# Patient Record
Sex: Male | Born: 1955 | Race: Black or African American | Hispanic: No | Marital: Married | Smoking: Current some day smoker
Health system: Southern US, Community
[De-identification: ages and names within clinical notes are randomized; demographics above are authoritative.]

## PROBLEM LIST (undated history)

## (undated) DIAGNOSIS — I10 Essential (primary) hypertension: Secondary | ICD-10-CM

## (undated) DIAGNOSIS — K219 Gastro-esophageal reflux disease without esophagitis: Secondary | ICD-10-CM

## (undated) HISTORY — DX: Gastro-esophageal reflux disease without esophagitis: K21.9

## (undated) HISTORY — PX: HERNIA REPAIR: SHX51

---

## 2000-07-11 ENCOUNTER — Emergency Department (HOSPITAL_COMMUNITY): Admission: EM | Admit: 2000-07-11 | Discharge: 2000-07-11 | Payer: Self-pay | Admitting: Emergency Medicine

## 2001-06-24 ENCOUNTER — Encounter: Payer: Self-pay | Admitting: Internal Medicine

## 2001-06-24 ENCOUNTER — Encounter: Admission: RE | Admit: 2001-06-24 | Discharge: 2001-06-24 | Payer: Self-pay | Admitting: Internal Medicine

## 2001-07-02 ENCOUNTER — Encounter: Admission: RE | Admit: 2001-07-02 | Discharge: 2001-07-02 | Payer: Self-pay | Admitting: *Deleted

## 2001-08-21 ENCOUNTER — Observation Stay (HOSPITAL_COMMUNITY): Admission: RE | Admit: 2001-08-21 | Discharge: 2001-08-22 | Payer: Self-pay | Admitting: General Surgery

## 2009-11-22 ENCOUNTER — Emergency Department (HOSPITAL_COMMUNITY): Admission: EM | Admit: 2009-11-22 | Discharge: 2009-11-22 | Payer: Self-pay | Admitting: Emergency Medicine

## 2010-01-04 ENCOUNTER — Ambulatory Visit: Payer: Self-pay | Admitting: Internal Medicine

## 2010-01-04 ENCOUNTER — Encounter (INDEPENDENT_AMBULATORY_CARE_PROVIDER_SITE_OTHER): Payer: Self-pay | Admitting: Family Medicine

## 2010-01-04 LAB — CONVERTED CEMR LAB: Microalb, Ur: 1.09 mg/dL (ref 0.00–1.89)

## 2010-03-07 ENCOUNTER — Ambulatory Visit: Payer: Self-pay | Admitting: Internal Medicine

## 2010-03-07 ENCOUNTER — Encounter (INDEPENDENT_AMBULATORY_CARE_PROVIDER_SITE_OTHER): Payer: Self-pay | Admitting: Family Medicine

## 2010-03-07 LAB — CONVERTED CEMR LAB
ALT: 15 units/L (ref 0–53)
AST: 17 units/L (ref 0–37)
Albumin: 4 g/dL (ref 3.5–5.2)
Alkaline Phosphatase: 61 units/L (ref 39–117)
BUN: 16 mg/dL (ref 6–23)
CO2: 22 meq/L (ref 19–32)
Calcium: 9.1 mg/dL (ref 8.4–10.5)
Chloride: 106 meq/L (ref 96–112)
Cholesterol: 179 mg/dL (ref 0–200)
Creatinine, Ser: 1.14 mg/dL (ref 0.40–1.50)
Glucose, Bld: 95 mg/dL (ref 70–99)
HDL: 45 mg/dL (ref >39)
LDL Cholesterol: 117 mg/dL — ABNORMAL HIGH (ref 0–99)
Potassium: 4.2 meq/L (ref 3.5–5.3)
Sodium: 140 meq/L (ref 135–145)
Total Bilirubin: 0.5 mg/dL (ref 0.3–1.2)
Total CHOL/HDL Ratio: 4
Total Protein: 7.3 g/dL (ref 6.0–8.3)
Triglycerides: 86 mg/dL (ref <150)
VLDL: 17 mg/dL (ref 0–40)

## 2010-03-28 ENCOUNTER — Encounter (INDEPENDENT_AMBULATORY_CARE_PROVIDER_SITE_OTHER): Payer: Self-pay | Admitting: *Deleted

## 2010-03-28 LAB — CONVERTED CEMR LAB: PSA: 0.5 ng/mL (ref <=4.00)

## 2010-09-29 NOTE — Op Note (Signed)
[facility]  Patient:    Erik Reed, Erik Reed Visit Number: [id number] MRN: [id number]          Service Type: SUR Location: [id number] Attending Physician:  Brandy Hale Dictated by:   Angelia Mould. Derrell Lolling, M.D. Proc. Date: 08/21/01 Admit Date:  08/21/2001   CC:         Helene Kelp, M.D.   Operative Report  PREOPERATIVE DIAGNOSES:  1. Bilateral inguinal hernias.  2. Incarcerated ventral epigastric hernia.  POSTOPERATIVE DIAGNOSES:  1. Bilateral inguinal hernias.  2. Incarcerated ventral epigastric hernia.  OPERATION PERFORMED:  1. Laparoscopic preperitoneal repair of bilateral inguinal hernia with     mesh.  2. Open onlay repair of incarcerated epigastric ventral hernia with onlay     mesh.  SURGEON:  Angelia Mould. Derrell Lolling, M.D.  FIRST ASSISTANT:  Abigail Miyamoto, M.D.  INDICATIONS FOR PROCEDURE:  This is a 55 year old black man who has noticed a bulge above his umbilicus for one year or possibly more. This is painful. He had a CT scan done by his primary care physician which showed a midline abdominal hernia through the linea alba 7 cm above the umbilicus with herniated omentum. He was also noted to have bilateral inguinal hernias. On exam, he has a tender reducible mass midway between the xiphoid and the umbilicus. This is a tender nonreducible mass midway between the xiphoid and the umbilicus. He has a moderate sized left inguinal hernia and small right inguinal hernia. Options for intervention were discussed including stage repair versus simultaneous repair of all his hernias. He wanted to have all of this done at one time. He was brought to the operating room for repair.  OPERATIVE FINDINGS:  The patient was short but very muscular and obese. This made it very difficult to visualize his anatomy laparoscopically. Nevertheless, we were able to identify all structures. With about two hours of work, we were able to get the bilateral  inguinal hernia repairs done. He had bilateral indirect inguinal hernias. The ventral epigastric hernia was about midway between the xiphoid and the umbilicus. The defect in the fascia was about 3 cm. There was incarcerated preperitoneal fat which was simply reduced.  OPERATIVE TECHNIQUE:  Following the induction of general endotracheal anesthesia, a Foley catheter was inserted. The abdomen was prepped and draped in a sterile fashion. A 0.5% Marcaine with epinephrine was used as a local infiltration anesthetic. A vertically oriented incision was made at the lower rim of the umbilicus. The fascia was incised transversely exposing the medial border of the right rectus muscle. We inserted the balloon dissector behind the right rectus muscle down to the symphysis pubis and inflated the balloon under direct vision with the camera in place using a manually inflatable air balloon. This had a fairly good deployment of the balloon. We held this in place for about four minutes. The balloon was deflated and removed and an insufflating trocar was inserted and secured. The preperitoneal space was insufflated at 12 mmHg.  As mentioned above, the operative space was small. There was some fatty tissue along the midline which had to be debrided and ultimately removed. During the case, we did get some small openings into the peritoneum which were closed with 5 mm tacking devices but we did place a Veress needle in the right upper quadrant during the case to keep the pneumoperitoneum reduced.  Because the muscles were so large and hanging down in our visual field, we had to place three 5  mm trocars, one on the right, one on the left and one in the midline below the camera trocar. On the left side, we dissected out the cord structures, we identified an indirect hernia and debrided it away from the cord structure all the way back to the level of the anterior superior iliac spine. We cleaned off Coopers  ligament, looked for any evidence of direct hernia and there was not a direct hernia. On the right, he also had a fairly sizeable indirect hernia sac which was dissected away from the cord structures all the way back to the level of the anterior superior iliac spine. We also looked for a direct hernia but did not find any evidence of direct hernia.  On each side, we placed a 4 inch x 6 inch piece of polypropylene mesh. The mesh was oriented and tacked in place with a 5 mm tacking device. The mesh was placed so that it would overlap the symphysis and Coopers ligament medially and was tacked in place along the superior rim of the symphysis and Coopers ligament bilaterally. The mesh was then tacked up the midline and it overlapped in the midline a little bit. The mesh was also secured across the posterior belly of the rectus muscle. Care was taken to avoid the inferior epigastric vessels. Laterally on each side, we placed 5 mm tacks into the muscle above the level of the iliopubic tract. Great care was taken laterally to be sure that we could palpate the tacking device through the skin before firing the tacker to avoid nerve damage.  After all of this was done, the hernia coverage looked good. A little bit of blood was suctioned out and there did not appear to be any active bleeding. We inspected the peritoneum and everything looked good. There was no hole left in the peritoneum at the completion of the case. The pneumoperitoneum was removed. All the trocars were removed. The fascia at the umbilicus was closed with figure-of-eight sutures of #0 Vicryl and skin closed with skin staples.  The hernia in the upper abdomen, we decided to go ahead with an anterior only approach because it was small. We made a midline incision about 10-12 cm in length above the umbilicus overlying the palpable mass. Dissection was carried down through the subcutaneous tissue until we identified the hernia. We  opened the fascia above and below this a little bit and debrided off the hernia sac  and found that there was a large egg size piece of preperitoneal fat which was simply reduced. We closed the fascia transversely with 5 interrupted sutures of #1 Novofil. We undermined the subcutaneous tissue all the way around for about 2 or 3 cm. We took a piece of polypropylene mesh approximately 8 or 9 cm vertically x about 5 cm transversely. This was sutured in place with about 7 or 8 interrupted mattress sutures of #0 Prolene. This overlapped the fascial repair quite nicely. The wound was irrigated with saline. A 19 [place] Blake drain was placed in the wound and brought out through the skin and sutured with nylon suture. This was connected to a suction bulb. The skin incisions were closed with skin staples. Clean bandages were placed, the patient was taken to the recovery room in stable condition. Estimated blood loss was about 75 cc. Complications none. Sponge, needle and instrument counts were correct.Dictated by:   Angelia Mould. Derrell Lolling, M.D. Attending Physician:  Brandy Hale DD:  08/21/01 TD:  08/22/01 Job: [phone] [id number]

## 2012-04-17 ENCOUNTER — Encounter (HOSPITAL_COMMUNITY): Payer: Self-pay | Admitting: Emergency Medicine

## 2012-04-17 ENCOUNTER — Emergency Department (INDEPENDENT_AMBULATORY_CARE_PROVIDER_SITE_OTHER)
Admission: EM | Admit: 2012-04-17 | Discharge: 2012-04-17 | Disposition: A | Discharge status: Home / Self Care | Payer: Self-pay | Source: Home / Self Care | Attending: Emergency Medicine | Admitting: Emergency Medicine

## 2012-04-17 DIAGNOSIS — Z760 Encounter for issue of repeat prescription: Secondary | ICD-10-CM

## 2012-04-17 DIAGNOSIS — I10 Essential (primary) hypertension: Secondary | ICD-10-CM

## 2012-04-17 HISTORY — DX: Essential (primary) hypertension: I10

## 2012-04-17 MED ORDER — LOSARTAN POTASSIUM-HCTZ 100-12.5 MG PO TABS: 1.0000 | ORAL_TABLET | Freq: Every day | ORAL | Status: AC

## 2012-04-17 NOTE — ED Provider Notes (Signed)
History     CSN: [id number]  Arrival date & time 04/17/12  4098   First MD Initiated Contact with Patient 04/17/12 [phone]      Chief Complaint  Patient presents with  . Medication Refill    needs refill on BP meds.    (Consider location/radiation/quality/duration/timing/severity/associated sxs/prior treatment) HPI Comments: Patient presents to urgent care describing that he's been having occasional headaches and lightheadedness as he feels his blood pressure has increased as he has not taken his blood pressure for 5 days as he ran out of it. Patient is a former patient of [facility]. Denies any chest pain palpitations or shortness of breath or lower extremity swelling.   The history is provided by the patient.    Past Medical History  Diagnosis Date  . Hypertension     History reviewed. No pertinent past surgical history.  Family History  Problem Relation Age of Onset  . Hypertension Other     History  Substance Use Topics  . Smoking status: Current Some Day Smoker    Types: Cigars  . Smokeless tobacco: Not on file  . Alcohol Use: Yes     Comment: occasional      Review of Systems  Constitutional: Negative for activity change and appetite change.  HENT: Negative for neck pain and neck stiffness.   Eyes: Negative for visual disturbance.  Respiratory: Negative for shortness of breath.   Cardiovascular: Negative for chest pain and leg swelling.  Neurological: Negative for dizziness, light-headedness and headaches.    Allergies  Review of patient's allergies indicates no known allergies.  Home Medications   Current Outpatient Rx  Name  Route  Sig  Dispense  Refill  . LOSARTAN POTASSIUM-HCTZ 100-12.5 MG PO TABS   Oral   Take 1 tablet by mouth daily.   30 tablet   2     BP 148/93  Pulse 75  Temp 97.9 F (36.6 C) (Oral)  Resp 16  SpO2 95%  Physical Exam  Nursing note and vitals reviewed. Constitutional: Vital signs are normal. He appears  well-developed and well-nourished.  Non-toxic appearance. He does not have a sickly appearance. He does not appear ill.  Eyes: Pupils are equal, round, and reactive to light.  Neck: Neck supple. No JVD present.  Cardiovascular: Normal rate, regular rhythm, normal heart sounds and normal pulses.  Exam reveals no gallop and no friction rub.   No murmur heard.      Bedside informational (non-diagnostic [facility] ECHO) performed single Long Parasternal axis view, no normal ventricular motion or left ventricular dilation or aortic or mitral gross abnormality noted.  Pulmonary/Chest: Effort normal and breath sounds normal.  Skin: Skin is warm.    ED Course  Procedures (including critical care time)  Labs Reviewed - No data to display No results found.   1. Hypertension   2. Encounter for medication refill       MDM  Patient asymptomatic during the clinical encounter requesting medication refills, for antihypertensive medicine. Patient was provided with a 60 day supply of Hyzaar, he was given information to establish continuity of care with the on-call Center at urgent care.        Jimmie Molly, MD 04/17/12 1113

## 2012-04-17 NOTE — ED Notes (Signed)
Pt is c/o HA with lightheadedness. Pt states that he has been without BP meds since 11/26.  Pt needs refill on medication

## 2013-05-04 ENCOUNTER — Ambulatory Visit: Payer: Self-pay | Attending: Internal Medicine

## 2013-05-11 ENCOUNTER — Ambulatory Visit: Payer: Self-pay

## 2016-06-07 ENCOUNTER — Encounter: Payer: Self-pay | Admitting: Internal Medicine

## 2019-03-27 ENCOUNTER — Other Ambulatory Visit: Payer: Self-pay | Admitting: Orthopaedic Surgery

## 2019-04-22 NOTE — H&P (Signed)
TOTAL HIP ADMISSION H&P  Patient is admitted for right total hip arthroplasty.  Subjective:  Chief Complaint: right hip pain  HPI: Erik Reed, 63 y.o. male, has a history of pain and functional disability in the right hip(s) due to arthritis and patient has failed non-surgical conservative treatments for greater than 12 weeks to include NSAID's and/or analgesics, corticosteriod injections, flexibility and strengthening excercises, use of assistive devices, weight reduction as appropriate and activity modification.  Onset of symptoms was gradual starting 5 years ago with gradually worsening course since that time.The patient noted no past surgery on the right hip(s).  Patient currently rates pain in the right hip at 10 out of 10 with activity. Patient has night pain, worsening of pain with activity and weight bearing, trendelenberg gait, pain that interfers with activities of daily living and crepitus. Patient has evidence of subchondral cysts, subchondral sclerosis, periarticular osteophytes and joint space narrowing by imaging studies. This condition presents safety issues increasing the risk of falls. There is no current active infection.  There are no active problems to display for this patient.  Past Medical History:  Diagnosis Date  . Hypertension     No past surgical history on file.  No current facility-administered medications for this encounter.    Current Outpatient Medications  Medication Sig Dispense Refill Last Dose  . aspirin EC 81 MG tablet Take 81 mg by mouth daily.     Marland Kitchen losartan-hydrochlorothiazide (HYZAAR) 100-12.5 MG per tablet Take 1 tablet by mouth daily. 30 tablet 2    No Known Allergies  Social History   Tobacco Use  . Smoking status: Current Some Day Smoker    Types: Cigars  Substance Use Topics  . Alcohol use: Yes    Comment: occasional    Family History  Problem Relation Age of Onset  . Hypertension Other      Review of Systems  Musculoskeletal:  Positive for joint pain.       Right hip  All other systems reviewed and are negative.   Objective:  Physical Exam  Constitutional: He is oriented to person, place, and time. He appears well-developed and well-nourished.  HENT:  Head: Normocephalic and atraumatic.  Eyes: Pupils are equal, round, and reactive to light.  Neck: Normal range of motion.  Cardiovascular: Normal rate and regular rhythm.  Respiratory: Effort normal.  GI: Soft.  Musculoskeletal:     Comments: Examination right hip shows significant limited range of motion with pain at end internal range of motion.  He has 5 out of 5 strength in all sensations of the right hip.  Negative straight leg raise.  He is neurovascularly intact distally.  Examination left hip shows full range of motion.  No tenderness palpation throughout.  Normal sensation throughout.  He is neurovascularly intact distally.  Neurological: He is alert and oriented to person, place, and time.  Skin: Skin is warm and dry.  Psychiatric: He has a normal mood and affect. His behavior is normal. Judgment and thought content normal.    Vital signs in last 24 hours: BP: ()/()  Arterial Line BP: ()/()   Labs:   There is no height or weight on file to calculate BMI.   Imaging Review Plain radiographs demonstrate severe degenerative joint disease of the right hip(s). The bone quality appears to be good for age and reported activity level.   Assessment/Plan:  End stage primary arthritis, right hip(s)  The patient history, physical examination, clinical judgement of the provider and imaging  studies are consistent with end stage degenerative joint disease of the right hip(s) and total hip arthroplasty is deemed medically necessary. The treatment options including medical management, injection therapy, arthroscopy and arthroplasty were discussed at length. The risks and benefits of total hip arthroplasty were presented and reviewed. The risks due to aseptic  loosening, infection, stiffness, dislocation/subluxation,  thromboembolic complications and other imponderables were discussed.  The patient acknowledged the explanation, agreed to proceed with the plan and consent was signed. Patient is being admitted for inpatient treatment for surgery, pain control, PT, OT, prophylactic antibiotics, VTE prophylaxis, progressive ambulation and ADL's and discharge planning.The patient is planning to be discharged home with home health services

## 2019-04-23 ENCOUNTER — Encounter (HOSPITAL_COMMUNITY): Payer: Self-pay

## 2019-04-23 NOTE — Progress Notes (Signed)
PCP - Velna Hatchet Cardiologist -   Chest x-ray -  EKG - 04/24/19 in epic Stress Test -  ECHO -  Cardiac Cath -   Sleep Study -  CPAP -   Fasting Blood Sugar -  Checks Blood Sugar _____ times a day  Blood Thinner Instructions: Aspirin Instructions: Last Dose:  Anesthesia review: PTT 41, abnormal EKG  Patient denies shortness of breath, fever, cough and chest pain at PAT appointment  none   Patient verbalized understanding of instructions that were given to them at the PAT appointment. Patient was also instructed that they will need to review over the PAT instructions again at home before surgery.

## 2019-04-23 NOTE — Patient Instructions (Signed)
DUE TO COVID-19 ONLY ONE VISITOR IS ALLOWED TO COME WITH YOU AND STAY IN THE WAITING ROOM ONLY DURING PRE OP AND PROCEDURE DAY OF SURGERY. THE 1 VISITOR MAY VISIT WITH YOU AFTER SURGERY IN YOUR PRIVATE ROOM DURING VISITING HOURS ONLY!  YOU NEED TO HAVE A COVID 19 TEST ON_______ @_______ , THIS TEST MUST BE DONE BEFORE SURGERY, COME  [address], [place] , [phone].  [facility]) ONCE YOUR COVID TEST IS COMPLETED, PLEASE BEGIN THE QUARANTINE INSTRUCTIONS AS OUTLINED IN YOUR HANDOUT.                Erik Reed  04/23/2019   Your procedure is scheduled on: 04-28-19   [facility]   Report to admitting at      0715  AM     Call this number if you have problems the morning of surgery [phone]    Remember: NO SOLID FOOD AFTER MIDNIGHT THE NIGHT PRIOR TO SURGERY. NOTHING BY MOUTH EXCEPT CLEAR LIQUIDS UNTIL    0640 am . PLEASE FINISH ENSURE DRINK PER SURGEON ORDER  WHICH NEEDS TO BE COMPLETED AT       0640 am then nothing by mouth .    CLEAR LIQUID DIET   Foods Allowed                                                                     Foods Excluded  Coffee and tea, regular and decaf                             liquids that you cannot  Plain Jell-O any favor except red or purple                                           see through such as: Fruit ices (not with fruit pulp)                                     milk, soups, orange juice  Iced Popsicles                                    All solid food Carbonated beverages, regular and diet                                    Cranberry, grape and apple juices Sports drinks like Gatorade Lightly seasoned clear broth or consume(fat free) Sugar, honey syrup  _____________________________________________________________________   BRUSH YOUR TEETH MORNING OF SURGERY AND RINSE YOUR MOUTH OUT, NO CHEWING GUM CANDY OR MINTS.     Take these medicines the morning of surgery with A  SIP OF WATER: none  You may not have any metal on your body including hair pins and              piercings  Do not wear jewelry,  lotions, powders or perfumes, deodorant                          Men may shave face and neck.   Do not bring valuables to the hospital. [facility] IS NOT             RESPONSIBLE   FOR VALUABLES.  Contacts, dentures or bridgework may not be worn into surgery.                 Please read over the following fact sheets you were given: _____________________________________________________________________          [facility] - Preparing for Surgery Before surgery, you can play an important role.  Because skin is not sterile, your skin needs to be as free of germs as possible.  You can reduce the number of germs on your skin by washing with CHG (chlorahexidine gluconate) soap before surgery.  CHG is an antiseptic cleaner which kills germs and bonds with the skin to continue killing germs even after washing. Please DO NOT use if you have an allergy to CHG or antibacterial soaps.  If your skin becomes reddened/irritated stop using the CHG and inform your nurse when you arrive at Short Stay. Do not shave (including legs and underarms) for at least 48 hours prior to the first CHG shower.  You may shave your face/neck. Please follow these instructions carefully:  1.  Shower with CHG Soap the night before surgery and the  morning of Surgery.  2.  If you choose to wash your hair, wash your hair first as usual with your  normal  shampoo.  3.  After you shampoo, rinse your hair and body thoroughly to remove the  shampoo.                           4.  Use CHG as you would any other liquid soap.  You can apply chg directly  to the skin and wash                       Gently with a scrungie or clean washcloth.  5.  Apply the CHG Soap to your body ONLY FROM THE NECK DOWN.   Do not use on face/ open                           Wound or open sores.  Avoid contact with eyes, ears mouth and genitals (private parts).                       Wash face,  Genitals (private parts) with your normal soap.             6.  Wash thoroughly, paying special attention to the area where your surgery  will be performed.  7.  Thoroughly rinse your body with warm water from the neck down.  8.  DO NOT shower/wash with your normal soap after using and rinsing off  the CHG Soap.                9.  Pat yourself dry with a clean towel.  10.  Wear clean pajamas.            11.  Place clean sheets on your bed the night of your first shower and do not  sleep with pets. Day of Surgery : Do not apply any lotions/deodorants the morning of surgery.  Please wear clean clothes to the hospital/surgery center.  FAILURE TO FOLLOW THESE INSTRUCTIONS MAY RESULT IN THE CANCELLATION OF YOUR SURGERY PATIENT SIGNATURE_________________________________  NURSE SIGNATURE__________________________________  ________________________________________________________________________   Erik Reed  An incentive spirometer is a tool that can help keep your lungs clear and active. This tool measures how well you are filling your lungs with each breath. Taking long deep breaths may help reverse or decrease the chance of developing breathing (pulmonary) problems (especially infection) following:  A long period of time when you are unable to move or be active. BEFORE THE PROCEDURE   If the spirometer includes an indicator to show your best effort, your nurse or respiratory therapist will set it to a desired goal.  If possible, sit up straight or lean slightly forward. Try not to slouch.  Hold the incentive spirometer in an upright position. INSTRUCTIONS FOR USE  1. Sit on the edge of your bed if possible, or sit up as far as you can in bed or on a chair. 2. Hold the incentive spirometer in an upright position. 3. Breathe out normally. 4. Place the mouthpiece in your  mouth and seal your lips tightly around it. 5. Breathe in slowly and as deeply as possible, raising the piston or the ball toward the top of the column. 6. Hold your breath for 3-5 seconds or for as long as possible. Allow the piston or ball to fall to the bottom of the column. 7. Remove the mouthpiece from your mouth and breathe out normally. 8. Rest for a few seconds and repeat Steps 1 through 7 at least 10 times every 1-2 hours when you are awake. Take your time and take a few normal breaths between deep breaths. 9. The spirometer may include an indicator to show your best effort. Use the indicator as a goal to work toward during each repetition. 10. After each set of 10 deep breaths, practice coughing to be sure your lungs are clear. If you have an incision (the cut made at the time of surgery), support your incision when coughing by placing a pillow or rolled up towels firmly against it. Once you are able to get out of bed, walk around indoors and cough well. You may stop using the incentive spirometer when instructed by your caregiver.  RISKS AND COMPLICATIONS  Take your time so you do not get dizzy or light-headed.  If you are in pain, you may need to take or ask for pain medication before doing incentive spirometry. It is harder to take a deep breath if you are having pain. AFTER USE  Rest and breathe slowly and easily.  It can be helpful to keep track of a log of your progress. Your caregiver can provide you with a simple table to help with this. If you are using the spirometer at home, follow these instructions: SEEK MEDICAL CARE IF:   You are having difficultly using the spirometer.  You have trouble using the spirometer as often as instructed.  Your pain medication is not giving enough relief while using the spirometer.  You develop fever of 100.5 F (38.1 C) or higher. SEEK IMMEDIATE MEDICAL CARE IF:   You cough up bloody sputum  that had not been present before.  You  develop fever of 102 F (38.9 C) or greater.  You develop worsening pain at or near the incision site. MAKE SURE YOU:   Understand these instructions.  Will watch your condition.  Will get help right away if you are not doing well or get worse. Document Released: 09/10/2006 Document Revised: 07/23/2011 Document Reviewed: 11/11/2006 Exit[employer] Patient Information 2014 Exit[employer], [place].   ________________________________________________________________________  WHAT IS A BLOOD TRANSFUSION? Blood Transfusion Information  A transfusion is the replacement of blood or some of its parts. Blood is made up of multiple cells which provide different functions.  Red blood cells carry oxygen and are used for blood loss replacement.  White blood cells fight against infection.  Platelets control bleeding.  Plasma helps clot blood.  Other blood products are available for specialized needs, such as hemophilia or other clotting disorders. BEFORE THE TRANSFUSION  Who gives blood for transfusions?   Healthy volunteers who are fully evaluated to make sure their blood is safe. This is blood bank blood. Transfusion therapy is the safest it has ever been in the practice of medicine. Before blood is taken from a donor, a complete history is taken to make sure that person has no history of diseases nor engages in risky social behavior (examples are intravenous drug use or sexual activity with multiple partners). The donor's travel history is screened to minimize risk of transmitting infections, such as malaria. The donated blood is tested for signs of infectious diseases, such as HIV and hepatitis. The blood is then tested to be sure it is compatible with you in order to minimize the chance of a transfusion reaction. If you or a relative donates blood, this is often done in anticipation of surgery and is not appropriate for emergency situations. It takes many days to process the donated blood. RISKS AND  COMPLICATIONS Although transfusion therapy is very safe and saves many lives, the main dangers of transfusion include:   Getting an infectious disease.  Developing a transfusion reaction. This is an allergic reaction to something in the blood you were given. Every precaution is taken to prevent this. The decision to have a blood transfusion has been considered carefully by your caregiver before blood is given. Blood is not given unless the benefits outweigh the risks. AFTER THE TRANSFUSION  Right after receiving a blood transfusion, you will usually feel much better and more energetic. This is especially true if your red blood cells have gotten low (anemic). The transfusion raises the level of the red blood cells which carry oxygen, and this usually causes an energy increase.  The nurse administering the transfusion will monitor you carefully for complications. HOME CARE INSTRUCTIONS  No special instructions are needed after a transfusion. You may find your energy is better. Speak with your caregiver about any limitations on activity for underlying diseases you may have. SEEK MEDICAL CARE IF:   Your condition is not improving after your transfusion.  You develop redness or irritation at the intravenous (IV) site. SEEK IMMEDIATE MEDICAL CARE IF:  Any of the following symptoms occur over the next 12 hours:  Shaking chills.  You have a temperature by mouth above 102 F (38.9 C), not controlled by medicine.  Chest, back, or muscle pain.  People around you feel you are not acting correctly or are confused.  Shortness of breath or difficulty breathing.  Dizziness and fainting.  You get a rash or develop hives.  You have a decrease  in urine output.  Your urine turns a dark color or changes to pink, red, or brown. Any of the following symptoms occur over the next 10 days:  You have a temperature by mouth above 102 F (38.9 C), not controlled by medicine.  Shortness of  breath.  Weakness after normal activity.  The white part of the eye turns yellow (jaundice).  You have a decrease in the amount of urine or are urinating less often.  Your urine turns a dark color or changes to pink, red, or brown. Document Released: 04/27/2000 Document Revised: 07/23/2011 Document Reviewed: 12/15/2007 [facility] Patient Information 2014 [place], [place].  _______________________________________________________________________

## 2019-04-24 ENCOUNTER — Other Ambulatory Visit: Payer: Self-pay

## 2019-04-24 ENCOUNTER — Encounter (HOSPITAL_COMMUNITY)
Admission: RE | Admit: 2019-04-24 | Discharge: 2019-04-24 | Disposition: A | Discharge status: Home / Self Care | Payer: Managed Care, Other (non HMO) | Source: Ambulatory Visit | Attending: Orthopaedic Surgery | Admitting: Orthopaedic Surgery

## 2019-04-24 ENCOUNTER — Ambulatory Visit (HOSPITAL_COMMUNITY)
Admission: RE | Admit: 2019-04-24 | Discharge: 2019-04-24 | Disposition: A | Discharge status: Home / Self Care | Payer: Managed Care, Other (non HMO) | Source: Ambulatory Visit | Attending: Orthopaedic Surgery | Admitting: Orthopaedic Surgery

## 2019-04-24 ENCOUNTER — Encounter (HOSPITAL_COMMUNITY): Payer: Self-pay

## 2019-04-24 ENCOUNTER — Other Ambulatory Visit (HOSPITAL_COMMUNITY)
Admission: RE | Admit: 2019-04-24 | Discharge: 2019-04-24 | Disposition: A | Discharge status: Home / Self Care | Payer: Managed Care, Other (non HMO) | Source: Ambulatory Visit | Attending: Orthopaedic Surgery | Admitting: Orthopaedic Surgery

## 2019-04-24 DIAGNOSIS — Z20828 Contact with and (suspected) exposure to other viral communicable diseases: Secondary | ICD-10-CM | POA: Insufficient documentation

## 2019-04-24 DIAGNOSIS — M1611 Unilateral primary osteoarthritis, right hip: Secondary | ICD-10-CM | POA: Diagnosis not present

## 2019-04-24 DIAGNOSIS — I498 Other specified cardiac arrhythmias: Secondary | ICD-10-CM | POA: Insufficient documentation

## 2019-04-24 DIAGNOSIS — Z01818 Encounter for other preprocedural examination: Secondary | ICD-10-CM

## 2019-04-24 LAB — CBC WITH DIFFERENTIAL/PLATELET
Abs Immature Granulocytes: 0.02 10*3/uL (ref 0.00–0.07)
Basophils Absolute: 0 10*3/uL (ref 0.0–0.1)
Basophils Relative: 1 %
Eosinophils Absolute: 0.1 10*3/uL (ref 0.0–0.5)
Eosinophils Relative: 2 %
HCT: 45.8 % (ref 39.0–52.0)
Hemoglobin: 14.6 g/dL (ref 13.0–17.0)
Immature Granulocytes: 0 %
Lymphocytes Relative: 37 %
Lymphs Abs: 1.6 10*3/uL (ref 0.7–4.0)
MCH: 26.7 pg (ref 26.0–34.0)
MCHC: 31.9 g/dL (ref 30.0–36.0)
MCV: 83.9 fL (ref 80.0–100.0)
Monocytes Absolute: 0.4 10*3/uL (ref 0.1–1.0)
Monocytes Relative: 10 %
Neutro Abs: 2.3 10*3/uL (ref 1.7–7.7)
Neutrophils Relative %: 50 %
Platelets: 322 10*3/uL (ref 150–400)
RBC: 5.46 MIL/uL (ref 4.22–5.81)
RDW: 14.9 % (ref 11.5–15.5)
WBC: 4.5 10*3/uL (ref 4.0–10.5)
nRBC: 0 % (ref 0.0–0.2)

## 2019-04-24 LAB — BASIC METABOLIC PANEL
Anion gap: 10 (ref 5–15)
BUN: 24 mg/dL — ABNORMAL HIGH (ref 8–23)
CO2: 26 mmol/L (ref 22–32)
Calcium: 9.4 mg/dL (ref 8.9–10.3)
Chloride: 103 mmol/L (ref 98–111)
Creatinine, Ser: 1.4 mg/dL — ABNORMAL HIGH (ref 0.61–1.24)
GFR calc Af Amer: >60 mL/min (ref >60)
GFR calc non Af Amer: 53 mL/min — ABNORMAL LOW (ref >60)
Glucose, Bld: 104 mg/dL — ABNORMAL HIGH (ref 70–99)
Potassium: 4.4 mmol/L (ref 3.5–5.1)
Sodium: 139 mmol/L (ref 135–145)

## 2019-04-24 LAB — URINALYSIS, ROUTINE W REFLEX MICROSCOPIC
Bilirubin Urine: NEGATIVE
Glucose, UA: NEGATIVE mg/dL
Hgb urine dipstick: NEGATIVE
Ketones, ur: NEGATIVE mg/dL
Leukocytes,Ua: NEGATIVE
Nitrite: NEGATIVE
Protein, ur: NEGATIVE mg/dL
Specific Gravity, Urine: 1.021 (ref 1.005–1.030)
pH: 6 (ref 5.0–8.0)

## 2019-04-24 LAB — APTT: aPTT: 41 seconds — ABNORMAL HIGH (ref 24–36)

## 2019-04-24 LAB — PROTIME-INR
INR: 0.9 (ref 0.8–1.2)
Prothrombin Time: 12.2 seconds (ref 11.4–15.2)

## 2019-04-24 LAB — SURGICAL PCR SCREEN
MRSA, PCR: NEGATIVE
Staphylococcus aureus: NEGATIVE

## 2019-04-25 LAB — NOVEL CORONAVIRUS, NAA (HOSP ORDER, SEND-OUT TO REF LAB; TAT 18-24 HRS): SARS-CoV-2, NAA: NOT DETECTED

## 2019-04-25 LAB — ABO/RH: ABO/RH(D): O POS

## 2019-04-27 MED ORDER — BUPIVACAINE LIPOSOME 1.3 % IJ SUSP
10.0000 mL | Freq: Once | INTRAMUSCULAR | Status: DC
  Filled 2019-04-27: qty 10

## 2019-04-27 MED ORDER — TRANEXAMIC ACID 1000 MG/10ML IV SOLN
2000.0000 mg | INTRAVENOUS | Status: DC
  Filled 2019-04-27: qty 20

## 2019-04-27 MED ORDER — DEXTROSE 5 % IV SOLN
3.0000 g | INTRAVENOUS | Status: AC
  Administered 2019-04-28: 10:00:00 3 g via INTRAVENOUS
  Filled 2019-04-27: qty 3

## 2019-04-28 ENCOUNTER — Observation Stay (HOSPITAL_COMMUNITY)
Admission: RE | Admit: 2019-04-28 | Discharge: 2019-04-28 | Disposition: A | Discharge status: Home / Self Care | Payer: Managed Care, Other (non HMO) | Attending: Orthopaedic Surgery | Admitting: Orthopaedic Surgery

## 2019-04-28 ENCOUNTER — Encounter (HOSPITAL_COMMUNITY)
Admission: RE | Disposition: A | Discharge status: Home / Self Care | Payer: Self-pay | Source: Home / Self Care | Attending: Orthopaedic Surgery

## 2019-04-28 ENCOUNTER — Ambulatory Visit (HOSPITAL_COMMUNITY): Payer: Managed Care, Other (non HMO) | Admitting: Anesthesiology

## 2019-04-28 ENCOUNTER — Ambulatory Visit (HOSPITAL_COMMUNITY): Payer: Managed Care, Other (non HMO)

## 2019-04-28 ENCOUNTER — Ambulatory Visit (HOSPITAL_COMMUNITY): Payer: Managed Care, Other (non HMO) | Admitting: Physician Assistant

## 2019-04-28 ENCOUNTER — Encounter (HOSPITAL_COMMUNITY): Payer: Self-pay | Admitting: Orthopaedic Surgery

## 2019-04-28 DIAGNOSIS — Z6839 Body mass index (BMI) 39.0-39.9, adult: Secondary | ICD-10-CM | POA: Insufficient documentation

## 2019-04-28 DIAGNOSIS — R262 Difficulty in walking, not elsewhere classified: Secondary | ICD-10-CM | POA: Diagnosis not present

## 2019-04-28 DIAGNOSIS — I10 Essential (primary) hypertension: Secondary | ICD-10-CM | POA: Diagnosis not present

## 2019-04-28 DIAGNOSIS — Z419 Encounter for procedure for purposes other than remedying health state, unspecified: Secondary | ICD-10-CM

## 2019-04-28 DIAGNOSIS — Z7982 Long term (current) use of aspirin: Secondary | ICD-10-CM | POA: Diagnosis not present

## 2019-04-28 DIAGNOSIS — M1611 Unilateral primary osteoarthritis, right hip: Principal | ICD-10-CM | POA: Insufficient documentation

## 2019-04-28 DIAGNOSIS — Z79899 Other long term (current) drug therapy: Secondary | ICD-10-CM | POA: Diagnosis not present

## 2019-04-28 DIAGNOSIS — Z8249 Family history of ischemic heart disease and other diseases of the circulatory system: Secondary | ICD-10-CM | POA: Diagnosis not present

## 2019-04-28 DIAGNOSIS — F17290 Nicotine dependence, other tobacco product, uncomplicated: Secondary | ICD-10-CM | POA: Diagnosis not present

## 2019-04-28 HISTORY — PX: TOTAL HIP ARTHROPLASTY: SHX124

## 2019-04-28 LAB — TYPE AND SCREEN
ABO/RH(D): O POS
Antibody Screen: NEGATIVE

## 2019-04-28 SURGERY — ARTHROPLASTY, HIP, TOTAL, ANTERIOR APPROACH
Anesthesia: General | Site: Hip | Laterality: Right

## 2019-04-28 MED ORDER — PROPOFOL 10 MG/ML IV BOLUS
INTRAVENOUS | Status: DC | PRN
  Administered 2019-04-28 (×4): 50 mg via INTRAVENOUS

## 2019-04-28 MED ORDER — CEFAZOLIN SODIUM-DEXTROSE 2-4 GM/100ML-% IV SOLN
2.0000 g | Freq: Four times a day (QID) | INTRAVENOUS | Status: DC
  Administered 2019-04-28: 2 g via INTRAVENOUS

## 2019-04-28 MED ORDER — LACTATED RINGERS IV SOLN: INTRAVENOUS | Status: DC

## 2019-04-28 MED ORDER — TRANEXAMIC ACID-NACL 1000-0.7 MG/100ML-% IV SOLN
1000.0000 mg | Freq: Once | INTRAVENOUS | Status: DC
  Filled 2019-04-28: qty 100

## 2019-04-28 MED ORDER — ACETAMINOPHEN 500 MG PO TABS
1000.0000 mg | ORAL_TABLET | Freq: Once | ORAL | Status: AC
  Administered 2019-04-28: 1000 mg via ORAL
  Filled 2019-04-28: qty 2

## 2019-04-28 MED ORDER — MIDAZOLAM HCL 2 MG/2ML IJ SOLN
INTRAMUSCULAR | Status: AC
  Filled 2019-04-28: qty 2

## 2019-04-28 MED ORDER — HYDROMORPHONE HCL 1 MG/ML IJ SOLN
INTRAMUSCULAR | Status: AC
  Filled 2019-04-28: qty 1

## 2019-04-28 MED ORDER — HYDROMORPHONE HCL 1 MG/ML IJ SOLN: 0.2500 mg | INTRAMUSCULAR | Status: DC | PRN

## 2019-04-28 MED ORDER — BUPIVACAINE HCL (PF) 0.25 % IJ SOLN
INTRAMUSCULAR | Status: AC
  Filled 2019-04-28: qty 30

## 2019-04-28 MED ORDER — MIDAZOLAM HCL 5 MG/5ML IJ SOLN
INTRAMUSCULAR | Status: DC | PRN
  Administered 2019-04-28: 2 mg via INTRAVENOUS

## 2019-04-28 MED ORDER — CHLORHEXIDINE GLUCONATE 4 % EX LIQD: 60.0000 mL | Freq: Once | CUTANEOUS | Status: DC

## 2019-04-28 MED ORDER — BUPIVACAINE HCL (PF) 0.25 % IJ SOLN
INTRAMUSCULAR | Status: DC | PRN
  Administered 2019-04-28: 30 mL

## 2019-04-28 MED ORDER — FENTANYL CITRATE (PF) 250 MCG/5ML IJ SOLN
INTRAMUSCULAR | Status: DC | PRN
  Administered 2019-04-28: 100 ug via INTRAVENOUS
  Administered 2019-04-28 (×3): 50 ug via INTRAVENOUS

## 2019-04-28 MED ORDER — CEFAZOLIN SODIUM-DEXTROSE 2-4 GM/100ML-% IV SOLN
INTRAVENOUS | Status: AC
  Filled 2019-04-28: qty 100

## 2019-04-28 MED ORDER — HYDROMORPHONE HCL 1 MG/ML IJ SOLN
0.2500 mg | INTRAMUSCULAR | Status: DC | PRN
  Administered 2019-04-28 (×4): 0.5 mg via INTRAVENOUS

## 2019-04-28 MED ORDER — LIDOCAINE 2% (20 MG/ML) 5 ML SYRINGE
INTRAMUSCULAR | Status: AC
  Filled 2019-04-28: qty 5

## 2019-04-28 MED ORDER — ASPIRIN EC 81 MG PO TBEC: 81.0000 mg | DELAYED_RELEASE_TABLET | Freq: Two times a day (BID) | ORAL | 0 refills | Status: AC

## 2019-04-28 MED ORDER — DEXAMETHASONE SODIUM PHOSPHATE 10 MG/ML IJ SOLN
INTRAMUSCULAR | Status: DC | PRN
  Administered 2019-04-28: 10 mg via INTRAVENOUS

## 2019-04-28 MED ORDER — DEXAMETHASONE SODIUM PHOSPHATE 10 MG/ML IJ SOLN
INTRAMUSCULAR | Status: AC
  Filled 2019-04-28: qty 1

## 2019-04-28 MED ORDER — OXYCODONE HCL 5 MG PO TABS: 5.0000 mg | ORAL_TABLET | Freq: Once | ORAL | Status: DC | PRN

## 2019-04-28 MED ORDER — SUCCINYLCHOLINE CHLORIDE 200 MG/10ML IV SOSY
PREFILLED_SYRINGE | INTRAVENOUS | Status: AC
  Filled 2019-04-28: qty 10

## 2019-04-28 MED ORDER — METHOCARBAMOL 500 MG IVPB - SIMPLE MED
500.0000 mg | Freq: Four times a day (QID) | INTRAVENOUS | Status: DC | PRN
  Administered 2019-04-28: 500 mg via INTRAVENOUS

## 2019-04-28 MED ORDER — PROMETHAZINE HCL 25 MG/ML IJ SOLN
6.2500 mg | INTRAMUSCULAR | Status: DC | PRN
  Administered 2019-04-28: 12.5 mg via INTRAVENOUS

## 2019-04-28 MED ORDER — POVIDONE-IODINE 10 % EX SWAB
2.0000 "application " | Freq: Once | CUTANEOUS | Status: AC
  Administered 2019-04-28: 2 via TOPICAL

## 2019-04-28 MED ORDER — STERILE WATER FOR IRRIGATION IR SOLN
Status: DC | PRN
  Administered 2019-04-28: 2000 mL

## 2019-04-28 MED ORDER — KETOROLAC TROMETHAMINE 15 MG/ML IJ SOLN: 15.0000 mg | Freq: Four times a day (QID) | INTRAMUSCULAR | Status: DC

## 2019-04-28 MED ORDER — OXYCODONE HCL 5 MG/5ML PO SOLN: 5.0000 mg | Freq: Once | ORAL | Status: DC | PRN

## 2019-04-28 MED ORDER — HYDROCODONE-ACETAMINOPHEN 5-325 MG PO TABS: 1.0000 | ORAL_TABLET | ORAL | 0 refills | Status: AC | PRN

## 2019-04-28 MED ORDER — PROPOFOL 10 MG/ML IV BOLUS
INTRAVENOUS | Status: AC
  Filled 2019-04-28: qty 20

## 2019-04-28 MED ORDER — KETOROLAC TROMETHAMINE 30 MG/ML IJ SOLN
INTRAMUSCULAR | Status: AC
  Filled 2019-04-28: qty 1

## 2019-04-28 MED ORDER — HYDROCODONE-ACETAMINOPHEN 7.5-325 MG PO TABS: 1.0000 | ORAL_TABLET | ORAL | Status: DC | PRN

## 2019-04-28 MED ORDER — TRANEXAMIC ACID 1000 MG/10ML IV SOLN
INTRAVENOUS | Status: DC | PRN
  Administered 2019-04-28: 2000 mg via TOPICAL

## 2019-04-28 MED ORDER — PROMETHAZINE HCL 25 MG/ML IJ SOLN
INTRAMUSCULAR | Status: AC
  Filled 2019-04-28: qty 1

## 2019-04-28 MED ORDER — KETOROLAC TROMETHAMINE 30 MG/ML IJ SOLN
30.0000 mg | Freq: Once | INTRAMUSCULAR | Status: AC
  Administered 2019-04-28: 30 mg via INTRAVENOUS

## 2019-04-28 MED ORDER — TRANEXAMIC ACID-NACL 1000-0.7 MG/100ML-% IV SOLN
1000.0000 mg | INTRAVENOUS | Status: AC
  Administered 2019-04-28: 10:00:00 1000 mg via INTRAVENOUS
  Filled 2019-04-28: qty 100

## 2019-04-28 MED ORDER — TIZANIDINE HCL 4 MG PO TABS: 4.0000 mg | ORAL_TABLET | Freq: Four times a day (QID) | ORAL | 1 refills | Status: AC | PRN

## 2019-04-28 MED ORDER — METHOCARBAMOL 500 MG PO TABS: 500.0000 mg | ORAL_TABLET | Freq: Four times a day (QID) | ORAL | Status: DC | PRN

## 2019-04-28 MED ORDER — FENTANYL CITRATE (PF) 250 MCG/5ML IJ SOLN
INTRAMUSCULAR | Status: AC
  Filled 2019-04-28: qty 5

## 2019-04-28 MED ORDER — BUPIVACAINE LIPOSOME 1.3 % IJ SUSP
INTRAMUSCULAR | Status: DC | PRN
  Administered 2019-04-28: 10 mL

## 2019-04-28 MED ORDER — ONDANSETRON HCL 4 MG/2ML IJ SOLN
INTRAMUSCULAR | Status: DC | PRN
  Administered 2019-04-28: 4 mg via INTRAVENOUS

## 2019-04-28 MED ORDER — ROCURONIUM BROMIDE 10 MG/ML (PF) SYRINGE
PREFILLED_SYRINGE | INTRAVENOUS | Status: AC
  Filled 2019-04-28: qty 10

## 2019-04-28 MED ORDER — ONDANSETRON HCL 4 MG/2ML IJ SOLN
INTRAMUSCULAR | Status: AC
  Filled 2019-04-28: qty 2

## 2019-04-28 MED ORDER — HYDROCODONE-ACETAMINOPHEN 5-325 MG PO TABS
1.0000 | ORAL_TABLET | ORAL | Status: DC | PRN
  Administered 2019-04-28: 1 via ORAL

## 2019-04-28 MED ORDER — 0.9 % SODIUM CHLORIDE (POUR BTL) OPTIME
TOPICAL | Status: DC | PRN
  Administered 2019-04-28: 1000 mL

## 2019-04-28 MED ORDER — METHOCARBAMOL 500 MG IVPB - SIMPLE MED
INTRAVENOUS | Status: AC
  Filled 2019-04-28: qty 50

## 2019-04-28 MED ORDER — LIDOCAINE 2% (20 MG/ML) 5 ML SYRINGE
INTRAMUSCULAR | Status: DC | PRN
  Administered 2019-04-28: 80 mg via INTRAVENOUS

## 2019-04-28 MED ORDER — SUGAMMADEX SODIUM 200 MG/2ML IV SOLN
INTRAVENOUS | Status: DC | PRN
  Administered 2019-04-28: 300 mg via INTRAVENOUS

## 2019-04-28 MED ORDER — ROCURONIUM BROMIDE 10 MG/ML (PF) SYRINGE
PREFILLED_SYRINGE | INTRAVENOUS | Status: DC | PRN
  Administered 2019-04-28 (×3): 10 mg via INTRAVENOUS
  Administered 2019-04-28: 60 mg via INTRAVENOUS

## 2019-04-28 MED ORDER — SODIUM CHLORIDE 0.9 % IV BOLUS
500.0000 mL | Freq: Two times a day (BID) | INTRAVENOUS | Status: DC
  Administered 2019-04-28: 500 mL via INTRAVENOUS

## 2019-04-28 MED ORDER — HYDROCODONE-ACETAMINOPHEN 5-325 MG PO TABS
ORAL_TABLET | ORAL | Status: AC
  Filled 2019-04-28: qty 1

## 2019-04-28 SURGICAL SUPPLY — 47 items
BAG DECANTER FOR FLEXI CONT (MISCELLANEOUS) ×3 IMPLANT
BALL HIP CERAMIC (Hips) ×1 IMPLANT
BALL HIP CERAMIC 32MM PLUS 9 ×1 IMPLANT
BLADE SAW SGTL 18X1.27X75 (BLADE) ×2 IMPLANT
BLADE SAW SGTL 18X1.27X75MM (BLADE) ×1
BOOTIES KNEE HIGH SLOAN (MISCELLANEOUS) ×3 IMPLANT
CELLS DAT CNTRL 66122 CELL SVR (MISCELLANEOUS) ×1 IMPLANT
COVER PERINEAL POST (MISCELLANEOUS) ×3 IMPLANT
COVER SURGICAL LIGHT HANDLE (MISCELLANEOUS) ×3 IMPLANT
COVER WAND RF STERILE (DRAPES) IMPLANT
DECANTER SPIKE VIAL GLASS SM (MISCELLANEOUS) ×3 IMPLANT
DRAPE IMP U-DRAPE 54X76 (DRAPES) ×3 IMPLANT
DRAPE STERI IOBAN 125X83 (DRAPES) ×3 IMPLANT
DRAPE U-SHAPE 47X51 STRL (DRAPES) ×6 IMPLANT
DRSG AQUACEL AG ADV 3.5X 6 (GAUZE/BANDAGES/DRESSINGS) ×3 IMPLANT
DURAPREP 26ML APPLICATOR (WOUND CARE) ×3 IMPLANT
ELECT BLADE TIP CTD 4 INCH (ELECTRODE) ×3 IMPLANT
ELECT REM PT RETURN 15FT ADLT (MISCELLANEOUS) ×3 IMPLANT
ELIMINATOR HOLE APEX DEPUY (Hips) ×3 IMPLANT
FEM STEM 12/14 TAPER SZ 4 HIP (Orthopedic Implant) ×3 IMPLANT
FEMORAL STEM 12/14 TPR SZ4 HIP (Orthopedic Implant) ×1 IMPLANT
GLOVE BIO SURGEON STRL SZ8 (GLOVE) ×6 IMPLANT
GLOVE BIOGEL PI IND STRL 8 (GLOVE) ×2 IMPLANT
GLOVE BIOGEL PI INDICATOR 8 (GLOVE) ×4
GOWN STRL REUS W/TWL XL LVL3 (GOWN DISPOSABLE) ×6 IMPLANT
HIP BALL CERAMIC (Hips) ×3 IMPLANT
HIP BALL CERAMIC 32MM PLUS 9 ×3 IMPLANT
HOLDER FOLEY CATH W/STRAP (MISCELLANEOUS) ×3 IMPLANT
KIT TURNOVER KIT A (KITS) IMPLANT
LINER ACETABULAR 32X50 (Liner) ×3 IMPLANT
LINER PINN ACET GRIP 50X100 ×3 IMPLANT
MANIFOLD NEPTUNE II (INSTRUMENTS) ×3 IMPLANT
NEEDLE HYPO 22GX1.5 SAFETY (NEEDLE) ×3 IMPLANT
NS IRRIG 1000ML POUR BTL (IV SOLUTION) ×3 IMPLANT
PACK ANTERIOR HIP CUSTOM (KITS) ×3 IMPLANT
PENCIL SMOKE EVACUATOR (MISCELLANEOUS) IMPLANT
PROTECTOR NERVE ULNAR (MISCELLANEOUS) ×3 IMPLANT
RTRCTR WOUND ALEXIS 18CM MED (MISCELLANEOUS) ×3
SUT ETHIBOND NAB CT1 #1 30IN (SUTURE) ×6 IMPLANT
SUT VIC AB 1 CT1 36 (SUTURE) ×3 IMPLANT
SUT VIC AB 2-0 CT1 27 (SUTURE) ×2
SUT VIC AB 2-0 CT1 TAPERPNT 27 (SUTURE) ×1 IMPLANT
SUT VIC AB 3-0 PS2 18 (SUTURE) ×2
SUT VIC AB 3-0 PS2 18XBRD (SUTURE) ×1 IMPLANT
SUT VLOC 180 0 24IN GS25 (SUTURE) ×3 IMPLANT
SYR 50ML LL SCALE MARK (SYRINGE) ×3 IMPLANT
YANKAUER SUCT BULB TIP 10FT TU (MISCELLANEOUS) ×3 IMPLANT

## 2019-04-28 NOTE — Evaluation (Signed)
Physical Therapy Evaluation Patient Details Name: Erik Reed MRN: [id number] DOB: 24-Jul-1955 Today's Date: 04/28/2019   History of Present Illness  s/p DA R THA. PMH: HTN  Clinical Impression  Patient evaluated by Physical Therapy with no further acute PT needs identified. All education has been completed and the patient has no further questions.  Pt is ready for d/c from PT standpoint.   See below for any follow-up Physical Therapy or equipment needs. PT is signing off. Thank you for this referral.      Follow Up Recommendations Follow surgeon's recommendation for DC plan and follow-up therapies    Equipment Recommendations  None recommended by PT    Recommendations for Other Services       Precautions / Restrictions Precautions Precautions: Fall Restrictions Weight Bearing Restrictions: No Other Position/Activity Restrictions: WBAT      Mobility  Bed Mobility               General bed mobility comments: NT in recliner  Transfers Overall transfer level: Needs assistance Equipment used: Rolling walker (2 wheeled) Transfers: Sit to/from Stand Sit to Stand: Min guard         General transfer comment: cues for hand placement  Ambulation/Gait Ambulation/Gait assistance: Min guard Gait Distance (Feet): 110 Feet Assistive device: Rolling walker (2 wheeled) Gait Pattern/deviations: Step-to pattern;Step-through pattern;Decreased stance time - right     General Gait Details: cues for RW safety  Stairs Stairs: Yes Stairs assistance: Min guard Stair Management: One rail Right;One rail Left;Forwards Number of Stairs: 5 General stair comments: cues for sequence and safety  Wheelchair Mobility    Modified Rankin (Stroke Patients Only)       Balance                                             Pertinent Vitals/Pain Pain Assessment: 0-10 Pain Score: 4  Pain Location: right hip Pain Descriptors / Indicators:  Grimacing;Guarding Pain Intervention(s): Limited activity within patient's tolerance;Monitored during session;Premedicated before session;Repositioned    Home Living Family/patient expects to be discharged to:: Private residence Living Arrangements: Spouse/significant other Available Help at Discharge: Family Type of Home: House Home Access: Stairs to enter   Technical brewer of Steps: 1 Home Layout: Two level Home Equipment: Environmental consultant - 2 wheels      Prior Function Level of Independence: Independent               Hand Dominance        Extremity/Trunk Assessment   Upper Extremity Assessment Upper Extremity Assessment: Overall WFL for tasks assessed    Lower Extremity Assessment Lower Extremity Assessment: RLE deficits/detail RLE Deficits / Details: ankle WFL, knee and hip grossly 3/5       Communication   Communication: No difficulties  Cognition Arousal/Alertness: Awake/alert Behavior During Therapy: WFL for tasks assessed/performed Overall Cognitive Status: Within Functional Limits for tasks assessed                                        General Comments      Exercises Total Joint Exercises Ankle Circles/Pumps: AROM;Both;10 reps Quad Sets: 10 reps;Both;AROM Heel Slides: AAROM;Right;10 reps   Assessment/Plan    PT Assessment All further PT needs can be met in the next venue of care  PT Problem List         PT Treatment Interventions      PT Goals (Current goals can be found in the Care Plan section)  Acute Rehab PT Goals PT Goal Formulation: All assessment and education complete, DC therapy    Frequency     Barriers to discharge        Co-evaluation               AM-PAC PT "6 Clicks" Mobility  Outcome Measure Help needed turning from your back to your side while in a flat bed without using bedrails?: A Little Help needed moving from lying on your back to sitting on the side of a flat bed without using  bedrails?: A Little Help needed moving to and from a bed to a chair (including a wheelchair)?: A Little Help needed standing up from a chair using your arms (e.g., wheelchair or bedside chair)?: A Little Help needed to walk in hospital room?: A Little Help needed climbing 3-5 steps with a railing? : A Little 6 Click Score: 18    End of Session Equipment Utilized During Treatment: Gait belt Activity Tolerance: Patient tolerated treatment well Patient left: with call bell/phone within reach;in chair   PT Visit Diagnosis: Difficulty in walking, not elsewhere classified (R26.2)    Time: 4098-2867 PT Time Calculation (min) (ACUTE ONLY): 15 min   Charges:   PT Evaluation $PT Eval Low Complexity: 1 Low          Matie Dimaano, PT   Acute Rehab Dept [facility]): [phone]   04/28/2019   [facility] 04/28/2019, 6:39 PM

## 2019-04-28 NOTE — Interval H&P Note (Signed)
History and Physical Interval Note:  04/28/2019 8:59 AM  Erik Reed  has presented today for surgery, with the diagnosis of RIGHT HIP DEGENERATIVE JOINT DISEASE.  The various methods of treatment have been discussed with the patient and family. After consideration of risks, benefits and other options for treatment, the patient has consented to  Procedure(s): RIGHT TOTAL HIP ARTHROPLASTY ANTERIOR APPROACH (Right) as a surgical intervention.  The patient's history has been reviewed, patient examined, no change in status, stable for surgery.  I have reviewed the patient's chart and labs.  Questions were answered to the patient's satisfaction.     Hessie Dibble

## 2019-04-28 NOTE — Anesthesia Preprocedure Evaluation (Addendum)
Anesthesia Evaluation  Patient identified by MRN, date of birth, ID band Patient awake    Reviewed: Allergy & Precautions, NPO status , Patient's Chart, lab work & pertinent test results  Airway Mallampati: III  TM Distance: >3 FB Neck ROM: Full    Dental no notable dental hx.    Pulmonary Current Smoker and Patient abstained from smoking.,    Pulmonary exam normal breath sounds clear to auscultation       Cardiovascular hypertension, Pt. on medications Normal cardiovascular exam Rhythm:Regular Rate:Normal  ECG: rate 77. Normal sinus rhythm with sinus arrhythmia Left axis deviation   Neuro/Psych negative neurological ROS  negative psych ROS   GI/Hepatic negative GI ROS, Neg liver ROS,   Endo/Other  Morbid obesity  Renal/GU negative Renal ROS     Musculoskeletal  (+) Arthritis ,   Abdominal   Peds  Hematology negative hematology ROS (+)   Anesthesia Other Findings RIGHT HIP DEGENERATIVE JOINT DISEASE  Reproductive/Obstetrics                            Anesthesia Physical Anesthesia Plan  ASA: III  Anesthesia Plan: General   Post-op Pain Management:    Induction: Intravenous  PONV Risk Score and Plan: 1 and Ondansetron, Dexamethasone, Midazolam and Treatment may vary due to age or medical condition  Airway Management Planned: Oral ETT  Additional Equipment:   Intra-op Plan:   Post-operative Plan: Extubation in OR  Informed Consent: I have reviewed the patients History and Physical, chart, labs and discussed the procedure including the risks, benefits and alternatives for the proposed anesthesia with the patient or authorized representative who has indicated his/her understanding and acceptance.     Dental advisory given  Plan Discussed with: CRNA  Anesthesia Plan Comments:         Anesthesia Quick Evaluation

## 2019-04-28 NOTE — Transfer of Care (Signed)
Immediate Anesthesia Transfer of Care Note  Patient: Erik Reed  Procedure(s) Performed: RIGHT TOTAL HIP ARTHROPLASTY ANTERIOR APPROACH (Right Hip)  Patient Location: PACU  Anesthesia Type:General  Level of Consciousness: awake, alert , oriented and patient cooperative  Airway & Oxygen Therapy: Patient Spontanous Breathing and Patient connected to face mask oxygen  Post-op Assessment: Report given to RN, Post -op Vital signs reviewed and stable and Patient moving all extremities  Post vital signs: Reviewed and stable  Last Vitals:  Vitals Value Taken Time  BP 150/103 04/28/19 1158  Temp    Pulse 87 04/28/19 1200  Resp 18 04/28/19 1200  SpO2 100 % 04/28/19 1200  Vitals shown include unvalidated device data.  Last Pain:  Vitals:   04/28/19 0705  TempSrc: Oral         Complications: No apparent anesthesia complications

## 2019-04-28 NOTE — Anesthesia Procedure Notes (Signed)
Procedure Name: Intubation Date/Time: 04/28/2019 10:00 AM Performed by: Mitzie Na, CRNA Pre-anesthesia Checklist: Patient identified, Emergency Drugs available, Suction available and Patient being monitored Patient Re-evaluated:Patient Re-evaluated prior to induction Oxygen Delivery Method: Circle system utilized Preoxygenation: Pre-oxygenation with 100% oxygen Induction Type: IV induction Ventilation: Mask ventilation without difficulty Laryngoscope Size: Mac and 4 Grade View: Grade I Tube type: Oral Tube size: 7.5 mm Number of attempts: 1 Airway Equipment and Method: Stylet and Oral airway Placement Confirmation: ETT inserted through vocal cords under direct vision,  positive ETCO2 and breath sounds checked- equal and bilateral Secured at: 24 cm Tube secured with: Tape Dental Injury: Teeth and Oropharynx as per pre-operative assessment

## 2019-04-28 NOTE — Op Note (Signed)
PRE-OP DIAGNOSIS:  RIGHT HIP DEGENERATIVE JOINT DISEASE POST-OP DIAGNOSIS:  same PROCEDURE: RIGHT TOTAL HIP ARTHROPLASTY ANTERIOR APPROACH ANESTHESIA:  General SURGEON:  Melrose Nakayama MD ASSISTANT:  Loni Dolly PA-C   INDICATIONS FOR PROCEDURE:  The patient is a 63 y.o. male with a long history of a painful hip.  This has persisted despite multiple conservative measures.  The patient has persisted with pain and dysfunction making rest and activity difficult.  A total hip replacement is offered as surgical treatment.  Informed operative consent was obtained after discussion of possible complications including reaction to anesthesia, infection, neurovascular injury, dislocation, DVT, PE, and death.  The importance of the postoperative rehab program to optimize result was stressed with the patient.  SUMMARY OF FINDINGS AND PROCEDURE:  Under the above anesthesia through a anterior approach an the Hana table a right THR was performed.  The patient had severe degenerative change and excellent bone quality.  We used DePuy components to replace the hip and these were size 4 high offset Actis femur capped with a +9 48mm ceramic hip ball.  On the acetabular side we used a size 50 Gription shell with a  plus 0 neutral polyethylene liner.  We did use a hole eliminator.  Loni Dolly PA-C assisted throughout and was invaluable to the completion of the case in that he helped position and retract while I performed the procedure.  He also closed simultaneously to help minimize OR time.  I used fluoroscopy throughout the case to check position of implants and leg lengths and read all of these views myself.  DESCRIPTION OF PROCEDURE:  The patient was taken to the OR suite where the above anesthetic was applied.  The patient was then positioned on the Hana table supine.  All bony prominences were appropriately padded.  Prep and drape was then performed in normal sterile fashion.  The patient was given kefzol preoperative  antibiotic and an appropriate time out was performed.  We then took an anterior approach to the right hip.  Dissection was taken through adipose to the tensor fascia lata fascia.  This structure was incised longitudinally and we dissected in the intermuscular interval just medial to this muscle.  Cobra retractors were placed superior and inferior to the femoral neck superficial to the capsule.  A capsular incision was then made and the retractors were placed along the femoral neck.  Xray was brought in to get a good level for the femoral neck cut which was made with an oscillating saw and osteotome.  The femoral head was removed with a corkscrew.  The acetabulum was exposed and some labral tissues were excised. Reaming was taken to the inside wall of the pelvis and sequentially up to 1 mm smaller than the actual component.  A trial of components was done and then the aforementioned acetabular shell was placed in appropriate tilt and anteversion confirmed by fluoroscopy. The liner was placed along with the hole eliminator and attention was turned to the femur.  The leg was brought down and over into adduction and the elevator bar was used to raise the femur up gently in the wound.  The piriformis was released with care taken to preserve the obturator internus attachment and all of the posterior capsule. The femur was reamed and then broached to the appropriate size.  A trial reduction was done and the aforementioned head and neck assembly gave [place] the best stability in extension with external rotation.  Leg lengths were felt to be about equal  by fluoroscopic exam.  The trial components were removed and the wound irrigated.  We then placed the femoral component in appropriate anteversion.  The head was applied to a dry stem neck and the hip again reduced.  It was again stable in the aforementioned position.  The would was irrigated again followed by re-approximation of anterior capsule with ethibond suture. Tensor  fascia was repaired with V-loc suture  followed by deep closure with #O and #2 undyed vicryl.  Skin was closed with subQ stitch and steristrips followed by a sterile dressing.  EBL and IOF can be obtained from anesthesia records.  DISPOSITION:  The patient was extubated in the OR and taken to PACU in stable condition to be admitted to the Orthopedic Surgery for appropriate post-op care to include perioperative antibiotics and DVT prophylaxis.

## 2019-04-29 ENCOUNTER — Encounter: Payer: Self-pay | Admitting: *Deleted

## 2019-04-29 NOTE — Anesthesia Postprocedure Evaluation (Signed)
Anesthesia Post Note  Patient: Erik Reed  Procedure(s) Performed: RIGHT TOTAL HIP ARTHROPLASTY ANTERIOR APPROACH (Right Hip)     Patient location during evaluation: PACU Anesthesia Type: General Level of consciousness: awake and alert Pain management: pain level controlled Vital Signs Assessment: post-procedure vital signs reviewed and stable Respiratory status: spontaneous breathing, nonlabored ventilation, respiratory function stable and patient connected to nasal cannula oxygen Cardiovascular status: blood pressure returned to baseline and stable Postop Assessment: no apparent nausea or vomiting Anesthetic complications: no    Last Vitals:  Vitals:   04/28/19 1630 04/28/19 1645  BP: 129/87 112/77  Pulse: 82 83  Resp:    Temp:    SpO2: 100% 100%    Last Pain:  Vitals:   04/28/19 1400  TempSrc:   PainSc: 5                  Caterra Ostroff P Dalina Samara

## 2021-12-15 IMAGING — RF DG HIP (WITH PELVIS) OPERATIVE*R*
1 series · 4 of 4 positions shown · non-contrast
Comparison: None.

CLINICAL DATA: Right anterior hip replacement

EXAM:
OPERATIVE right HIP (WITH PELVIS IF PERFORMED) 4 VIEWS
TECHNIQUE: Fluoroscopic spot image(s) were submitted for interpretation
post-operatively.

[Series 1: unknown protocol · 0.20mm/px · 4 of 4 slices shown]
[im 1/4]
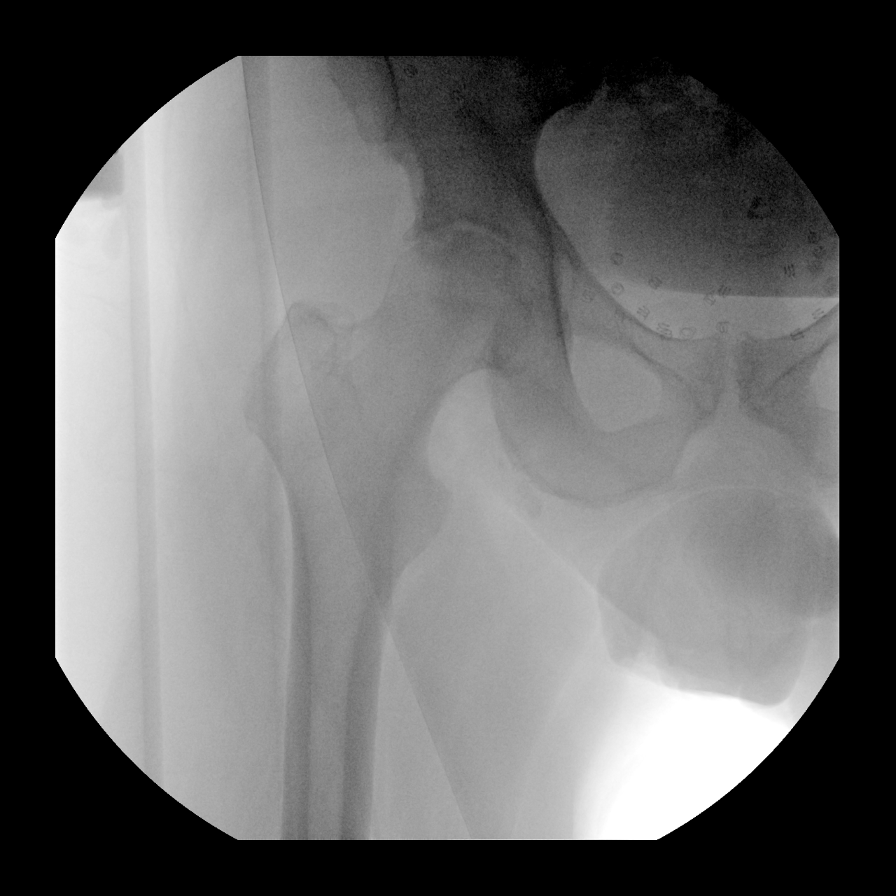
[im 2/4]
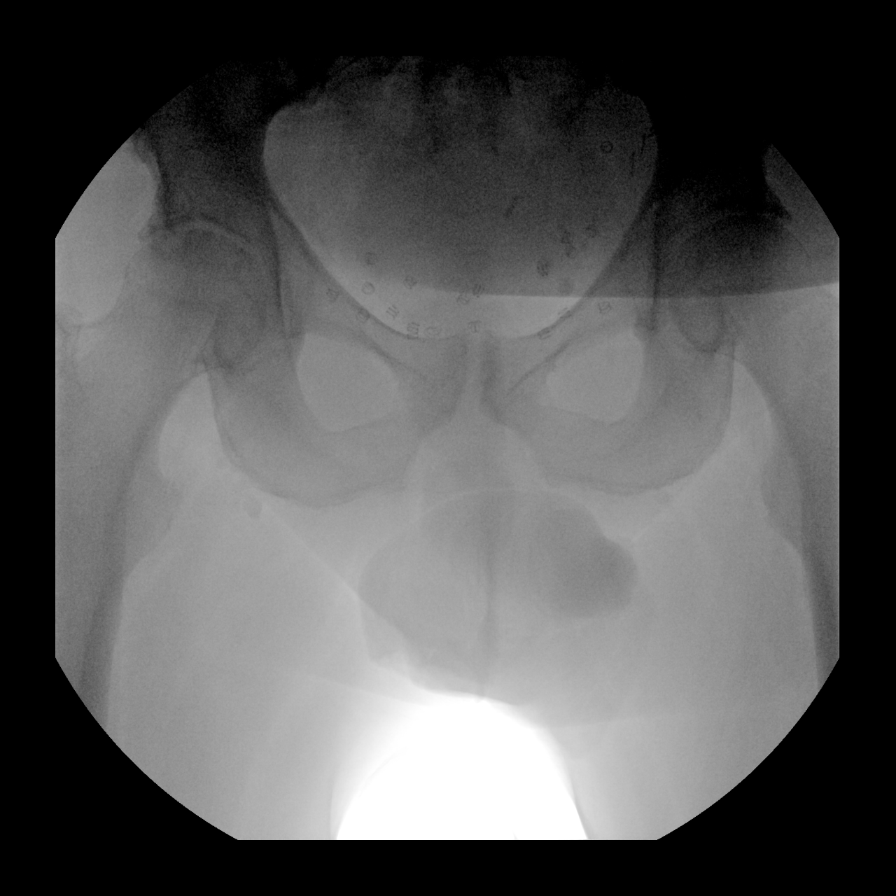
[im 3/4]
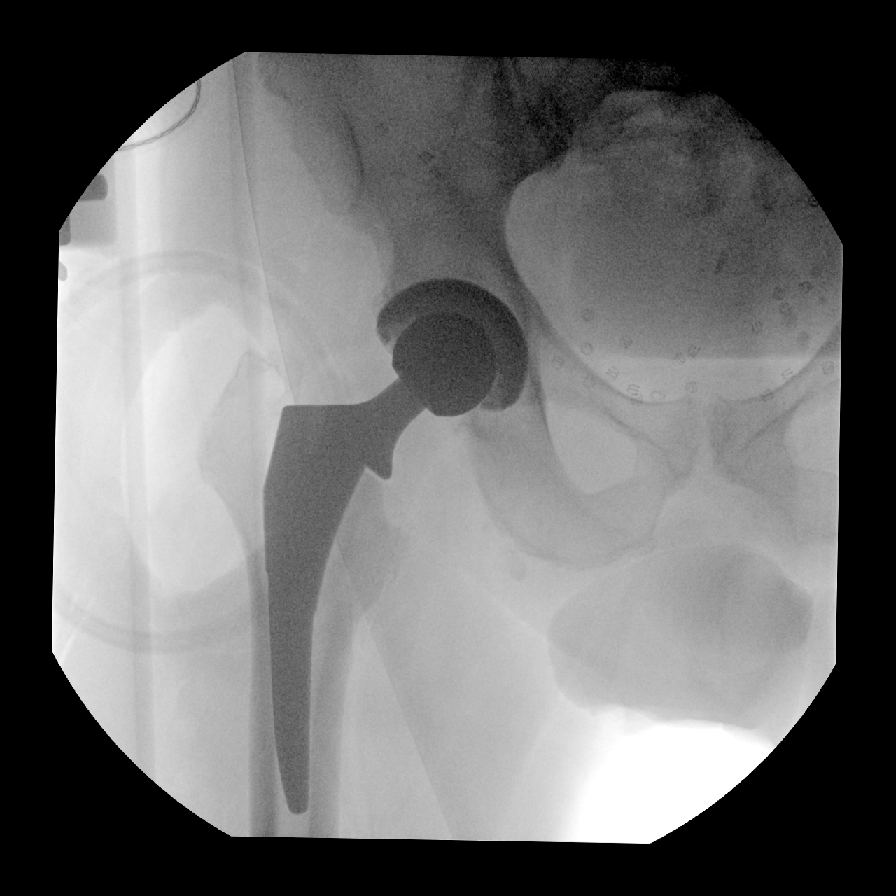
[im 4/4]
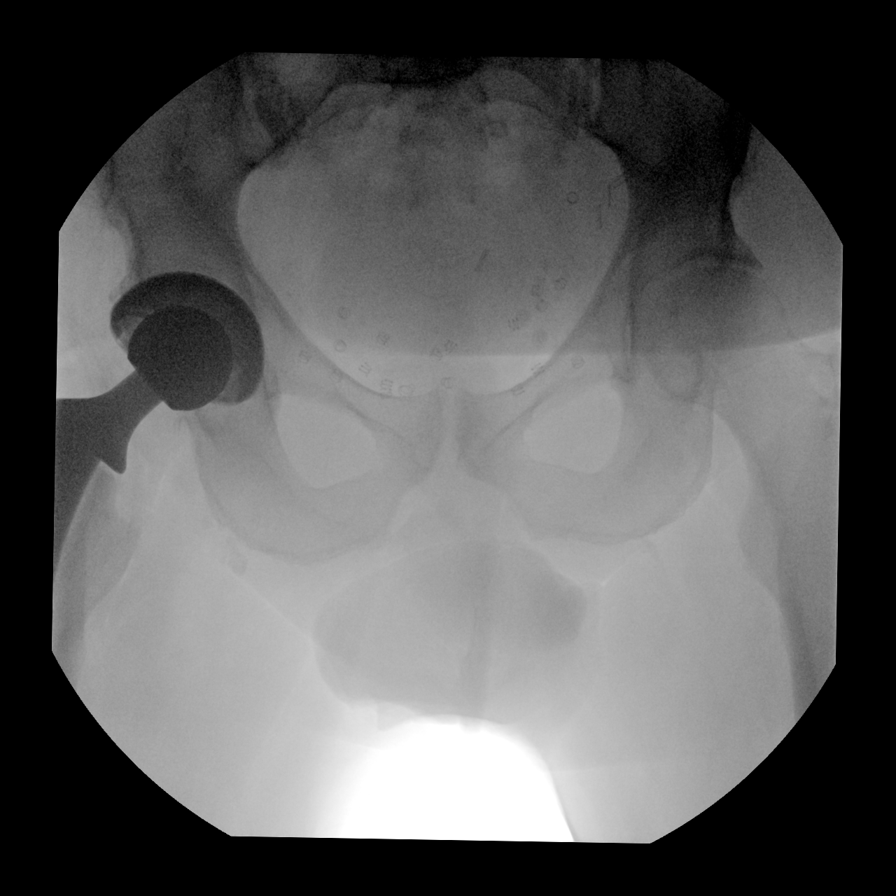

[4 of 4 positions shown; findings below may reference images not displayed]

FINDINGS: Multiple intraoperative fluoroscopic spot images are provided.
Interval right total hip arthroplasty.

FLUOROSCOPY TIME:  15 seconds
IMPRESSION: Intraoperative localization.

## 2022-08-30 ENCOUNTER — Other Ambulatory Visit (HOSPITAL_COMMUNITY): Payer: Self-pay | Admitting: Internal Medicine

## 2022-08-30 ENCOUNTER — Ambulatory Visit (HOSPITAL_COMMUNITY)
Admission: RE | Admit: 2022-08-30 | Discharge: 2022-08-30 | Disposition: A | Discharge status: Home / Self Care | Payer: Managed Care, Other (non HMO) | Source: Ambulatory Visit | Attending: Vascular Surgery | Admitting: Vascular Surgery

## 2022-08-30 DIAGNOSIS — Z720 Tobacco use: Secondary | ICD-10-CM

## 2022-12-05 ENCOUNTER — Other Ambulatory Visit: Payer: Self-pay

## 2022-12-05 ENCOUNTER — Emergency Department (HOSPITAL_BASED_OUTPATIENT_CLINIC_OR_DEPARTMENT_OTHER)
Admission: EM | Admit: 2022-12-05 | Discharge: 2022-12-05 | Disposition: A | Discharge status: Home / Self Care | Payer: Managed Care, Other (non HMO) | Attending: Emergency Medicine | Admitting: Emergency Medicine

## 2022-12-05 ENCOUNTER — Encounter (HOSPITAL_BASED_OUTPATIENT_CLINIC_OR_DEPARTMENT_OTHER): Payer: Self-pay | Admitting: Emergency Medicine

## 2022-12-05 DIAGNOSIS — Z79899 Other long term (current) drug therapy: Secondary | ICD-10-CM | POA: Diagnosis not present

## 2022-12-05 DIAGNOSIS — L03012 Cellulitis of left finger: Secondary | ICD-10-CM | POA: Insufficient documentation

## 2022-12-05 DIAGNOSIS — Z7982 Long term (current) use of aspirin: Secondary | ICD-10-CM | POA: Diagnosis not present

## 2022-12-05 DIAGNOSIS — L539 Erythematous condition, unspecified: Secondary | ICD-10-CM | POA: Diagnosis present

## 2022-12-05 DIAGNOSIS — I10 Essential (primary) hypertension: Secondary | ICD-10-CM | POA: Diagnosis not present

## 2022-12-05 MED ORDER — AMOXICILLIN-POT CLAVULANATE 875-125 MG PO TABS: 1.0000 | ORAL_TABLET | Freq: Two times a day (BID) | ORAL | 0 refills | Status: AC

## 2022-12-05 NOTE — Discharge Instructions (Signed)
You are seen in the emergency department for concerns of a finger infection.  Given that your finger has been draining pus and the area does not appear to be actively infected at this time as there is minimal redness and no active drainage, I believe he likely had a paronychia which has not been draining.  Given the lingering minimal amount of redness, I sent a prescription for Augmentin which he should take as prescribed for the next 5 days.  If you have any acute worsening or symptoms please return the emergency department or follow-up with your primary care provider.

## 2022-12-05 NOTE — ED Triage Notes (Signed)
Complains of infection around nail on left ring finger- 3 weeks. Has been applying neosporin. Has improved over past several days, but concerned for worsening. No fevers, small amount of pus when skin pulled out from under nail edge.

## 2022-12-05 NOTE — ED Provider Notes (Signed)
[place] EMERGENCY DEPARTMENT AT [facility] Provider Note   CSN: [id number] Arrival date & time: 12/05/22  1620     History Chief Complaint  Patient presents with   Finger Injury    Erik Reed is a 67 y.o. male.  Patient with past history significant for hypertension presents to the emergency department with concerns of a finger injury.  Reports has been ongoing for about 3 weeks when he noted a possible infection along the lateral edge of the left ring finger.  Patient has been applying bacitracin on this over the last couple days with some drainage noted.  Denies any worsening or increasing redness or pain in the finger.  No significant pain with movement of the finger.  Denies any fevers, generalized weakness or fatigue.  HPI     Home Medications Prior to Admission medications   Medication Sig Start Date End Date Taking? Authorizing Provider  amoxicillin-clavulanate (AUGMENTIN) 875-125 MG tablet Take 1 tablet by mouth every 12 (twelve) hours for 5 days. 12/05/22 12/10/22 Yes Smitty Knudsen, PA-C  aspirin EC 81 MG tablet Take 1 tablet (81 mg total) by mouth 2 (two) times daily after a meal. 04/28/19   Elodia Florence, PA-C  losartan-hydrochlorothiazide (HYZAAR) 100-12.5 MG per tablet Take 1 tablet by mouth daily. 04/17/12   Jimmie Molly, MD      Allergies    Patient has no known allergies.    Review of Systems   Review of Systems  Skin:  Positive for wound.  All other systems reviewed and are negative.   Physical Exam Updated Vital Signs BP 114/83 (BP Location: Right Arm)   Pulse 91   Temp 98.5 F (36.9 C)   Resp 17   SpO2 95%  Physical Exam Vitals and nursing note reviewed.  HENT:     Head: Normocephalic and atraumatic.  Eyes:     General: No scleral icterus.       Right eye: No discharge.        Left eye: No discharge.  Cardiovascular:     Rate and Rhythm: Normal rate and regular rhythm.  Skin:    General: Skin is warm and dry.     Findings:  Erythema and lesion present. No rash.          Comments: Small area of erythema along the medial aspect of the left ring finger lateral nail fold. No obvious drainage present at this moment.     ED Results / Procedures / Treatments   Labs (all labs ordered are listed, but only abnormal results are displayed) Labs Reviewed - No data to display  EKG None  Radiology No results found.  Procedures Procedures   Medications Ordered in ED Medications - No data to display  ED Course/ Medical Decision Making/ A&P                           Medical Decision Making  This patient presents to the ED for concern of finger injury.  Differential diagnosis includes cellulitis, ingrown nail, paronychia   Problem List / ED Course:  Patient presents to the emergency department complaints of a finger injury.  Reports has been several weeks where he has noted some redness in the left index finger.  States that the finger also has had some mild drainage over the last few days after he started applying Neosporin.  Denies any spreading or worsening pain or redness in the finger.  Denies  any fevers.  Low concern for septic joint or rapidly progressing cellulitis.  Appears the patient declined a paronychia that has not been draining and no appreciable fluctuance is noted.  Low concern for needed incision and drainage at this time as patient is likely had most fluid pain on its own.  Will send a prescription for Augmentin to patient's pharmacy for continued erythema concerning for cellulitis in the finger.  Patient is agreeable treatment plan verbalized understanding return precautions. All questions answered prior to patient discharge.  Final Clinical Impression(s) / ED Diagnoses Final diagnoses:  Paronychia of finger, left    Rx / DC Orders ED Discharge Orders          Ordered    amoxicillin-clavulanate (AUGMENTIN) 875-125 MG tablet  Every 12 hours        12/05/22 2003              Smitty Knudsen, PA-C 12/05/22 2005    Tanda Rockers A, DO 12/07/22 1515

## 2023-12-26 ENCOUNTER — Encounter: Payer: Self-pay | Admitting: Internal Medicine

## 2024-01-02 ENCOUNTER — Encounter: Payer: Self-pay | Admitting: Gastroenterology

## 2024-01-24 ENCOUNTER — Ambulatory Visit (AMBULATORY_SURGERY_CENTER): Admitting: *Deleted

## 2024-01-24 ENCOUNTER — Encounter: Payer: Self-pay | Admitting: Gastroenterology

## 2024-01-24 VITALS — Ht 67.5 in | Wt 280.0 lb

## 2024-01-24 DIAGNOSIS — Z1211 Encounter for screening for malignant neoplasm of colon: Secondary | ICD-10-CM

## 2024-01-24 MED ORDER — NA SULFATE-K SULFATE-MG SULF 17.5-3.13-1.6 GM/177ML PO SOLN: 1.0000 | Freq: Once | ORAL | 0 refills | Status: AC

## 2024-01-24 NOTE — Progress Notes (Signed)
 Pt's name and DOB verified at the beginning of the pre-visit with 2 identifiers  Permission given to speak with  Pt denies any difficulty with ambulating,sitting, laying down or rolling side to side  Pt has no issues moving head neck or swallowing  No egg or soy allergy known to patient   No issues known to pt with past sedation  No FH of Malignant Hyperthermia  Pt is not on home 02   Pt is not on blood thinners   Pt denies issues with constipation   Pt is not on dialysis  Pt denise any abnormal heart rhythms   Pt denies any upcoming cardiac testing  Patient's chart reviewed by Norleen Schillings CNRA prior to pre-visit and patient appropriate for the [facility].  Pre-visit completed and red dot placed by patient's name on their procedure day (on provider's schedule).    Visit in person  Pt states weight is 280 lb  Pt given  both [facility] main # and MD on call # prior to instructions.  Informed pt to come in at the time discussed and is shown on PV instructions.  Pt instructed to use [employer] or GoodRx for a price reduction on prep  Instructed pt where to find PV instructions in My Chart  Instructed pt on all aspects of written instructions including med holds clothing to wear and foods to eat and not eat as well as after procedure legal restrictions and to call MD on call if needed.. Pt states understanding. Instructed pt to review instructions again prior to procedure and call main # given if has any questions or any issues. Pt states they will.

## 2024-02-06 ENCOUNTER — Encounter: Payer: Self-pay | Admitting: Gastroenterology

## 2024-02-06 ENCOUNTER — Ambulatory Visit: Admitting: Gastroenterology

## 2024-02-06 VITALS — BP 132/77 | HR 65 | Temp 97.2°F | Resp 16 | Ht 67.5 in | Wt 280.0 lb

## 2024-02-06 DIAGNOSIS — K648 Other hemorrhoids: Secondary | ICD-10-CM | POA: Diagnosis not present

## 2024-02-06 DIAGNOSIS — K5730 Diverticulosis of large intestine without perforation or abscess without bleeding: Secondary | ICD-10-CM | POA: Diagnosis not present

## 2024-02-06 DIAGNOSIS — D122 Benign neoplasm of ascending colon: Secondary | ICD-10-CM | POA: Diagnosis not present

## 2024-02-06 DIAGNOSIS — Z860100 Personal history of colon polyps, unspecified: Secondary | ICD-10-CM

## 2024-02-06 DIAGNOSIS — Z1211 Encounter for screening for malignant neoplasm of colon: Secondary | ICD-10-CM | POA: Diagnosis present

## 2024-02-06 MED ORDER — SODIUM CHLORIDE 0.9 % IV SOLN: 500.0000 mL | Freq: Once | INTRAVENOUS | Status: DC

## 2024-02-06 NOTE — Progress Notes (Signed)
 Called to room to assist during endoscopic procedure.  Patient ID and intended procedure confirmed with present staff. Received instructions for my participation in the procedure from the performing physician.

## 2024-02-06 NOTE — Op Note (Signed)
 [facility] Patient Name: Erik Reed Procedure Date: 02/06/2024 7:55 AM MRN: [id number] Endoscopist: Victory L. Legrand , MD, [id number] Age: 68 Referring MD:  Date of Birth: 20-Dec-1955 Gender: Male Account #: [id number] Procedure:                Colonoscopy Indications:              Surveillance: Personal history of colonic polyps                            (unknown histology) on last colonoscopy more than 5                            years ago                           5 to 6 years ago at outside GI practice Medicines:                Monitored Anesthesia Care Procedure:                Pre-Anesthesia Assessment:                           - Prior to the procedure, a History and Physical                            was performed, and patient medications and                            allergies were reviewed. The patient's tolerance of                            previous anesthesia was also reviewed. The risks                            and benefits of the procedure and the sedation                            options and risks were discussed with the patient.                            All questions were answered, and informed consent                            was obtained. Prior Anticoagulants: The patient has                            taken no anticoagulant or antiplatelet agents. ASA                            Grade Assessment: III - A patient with severe                            systemic disease. After reviewing the risks and  benefits, the patient was deemed in satisfactory                            condition to undergo the procedure.                           After obtaining informed consent, the colonoscope                            was passed under direct vision. Throughout the                            procedure, the patient's blood pressure, pulse, and                            oxygen saturations were monitored continuously. The                             Olympus Scope [id number] was introduced through the                            anus and advanced to the the cecum, identified by                            appendiceal orifice and ileocecal valve. The                            colonoscopy was somewhat difficult due to a                            redundant colon. Successful completion of the                            procedure was aided by using manual pressure and                            straightening and shortening the scope to obtain                            bowel loop reduction. The patient tolerated the                            procedure well. The quality of the bowel                            preparation was excellent. The ileocecal valve,                            appendiceal orifice, and rectum were photographed.                            The bowel preparation used was SUPREP. Scope In: 8:03:25 AM Scope Out: 8:18:07 AM Scope Withdrawal Time: 0 hours 9 minutes 29 seconds  Total Procedure Duration:  0 hours 14 minutes 42 seconds  Findings:                 The perianal and digital rectal examinations were                            normal.                           Repeat examination of right colon under NBI                            performed.                           A diminutive polyp was found in the ascending                            colon. The polyp was sessile. The polyp was removed                            with a cold snare. Resection and retrieval were                            complete.                           Multiple diverticula were found in the left colon.                           Internal hemorrhoids were found.                           The exam was otherwise without abnormality on                            direct and retroflexion views. Complications:            No immediate complications. Estimated Blood Loss:     Estimated blood loss was minimal. Impression:                - One diminutive polyp in the ascending colon,                            removed with a cold snare. Resected and retrieved.                           - Diverticulosis in the left colon.                           - Internal hemorrhoids.                           - The examination was otherwise normal on direct                            and retroflexion views. Recommendation:           -  Patient has a contact number available for                            emergencies. The signs and symptoms of potential                            delayed complications were discussed with the                            patient. Return to normal activities tomorrow.                            Written discharge instructions were provided to the                            patient.                           - Resume previous diet.                           - Continue present medications.                           - Await pathology results.                           - Repeat colonoscopy is recommended for                            surveillance. The colonoscopy date will be                            determined after pathology results from today's                            exam become available for review. Anona Giovannini L. Legrand, MD 02/06/2024 8:22:48 AM This report has been signed electronically.

## 2024-02-06 NOTE — Progress Notes (Signed)
 Vss nad trans to pacu

## 2024-02-06 NOTE — Progress Notes (Signed)
 History and Physical:  This patient presents for endoscopic testing for: Encounter Diagnosis  Name Primary?   Hx of colonic polyps Yes    69 year old man here today for a surveillance colonoscopy.  He reports a colonoscopy 5 or 6 years ago at another practice in town where a polyp was removed (no reports available from PCP or in this EHR) No family history of colorectal cancer Patient denies chronic abdominal pain, rectal bleeding, constipation or diarrhea.   Patient is otherwise without complaints or active issues today.   Past Medical History: Past Medical History:  Diagnosis Date   GERD (gastroesophageal reflux disease)    Hypertension      Past Surgical History: Past Surgical History:  Procedure Laterality Date   HERNIA REPAIR     right inguinal   TOTAL HIP ARTHROPLASTY Right 04/28/2019   Procedure: RIGHT TOTAL HIP ARTHROPLASTY ANTERIOR APPROACH;  Surgeon: Sheril Coy, MD;  Location: [facility]S;  Service: Orthopedics;  Laterality: Right;    Allergies: No Known Allergies  Outpatient Meds: Current Outpatient Medications  Medication Sig Dispense Refill   aspirin  EC 81 MG tablet Take 1 tablet (81 mg total) by mouth 2 (two) times daily after a meal. 60 tablet 0   aspirin  EC 81 MG tablet Take 81 mg by mouth daily. Swallow whole.     Bioflavonoid Products (VITAMIN C) CHEW as directed Orally     losartan -hydrochlorothiazide (HYZAAR) 100-12.5 MG per tablet Take 1 tablet by mouth daily. 30 tablet 2   Current Facility-Administered Medications  Medication Dose Route Frequency Provider Last Rate Last Admin   0.9 %  sodium chloride  infusion  500 mL Intravenous Once Danis, Victory CROME III, MD          ___________________________________________________________________ Objective   Exam:  BP (!) 147/104   Pulse 75   Temp (!) 97.2 F (36.2 C)   Ht 5' 7.5 (1.715 m)   Wt 280 lb (127 kg)   SpO2 96%   BMI 43.21 kg/m   CV: regular , S1/S2 Resp: clear to auscultation  bilaterally, normal RR and effort noted GI: soft, no tenderness, with active bowel sounds.   Assessment: Encounter Diagnosis  Name Primary?   Hx of colonic polyps Yes     Plan: Colonoscopy   The benefits and risks of the planned procedure(s) were described in detail with the patient or (when appropriate) their health care proxy.  Risks were outlined as including, but not limited to, bleeding, infection, perforation, adverse medication reaction leading to cardiac or pulmonary decompensation, pancreatitis (if ERCP).  The limitation of incomplete mucosal visualization was also discussed.  No guarantees or warranties were given.  The patient is appropriate for an endoscopic procedure in the ambulatory setting.   - Victory Brand, MD

## 2024-02-06 NOTE — Progress Notes (Signed)
 Pt's states no medical or surgical changes since previsit or office visit.

## 2024-02-06 NOTE — Patient Instructions (Signed)
 Handouts given: Polyps, Hemorrhoids, Diverticulosis Resume previous diet. Continue present medications.  Await pathology results. Repeat colonoscopy is recommended for surveillance based on pathology results.  YOU HAD AN ENDOSCOPIC PROCEDURE TODAY AT THE [facility]:   Refer to the procedure report that was given to you for any specific questions about what was found during the examination.  If the procedure report does not answer your questions, please call your gastroenterologist to clarify.  If you requested that your care partner not be given the details of your procedure findings, then the procedure report has been included in a sealed envelope for you to review at your convenience later.  YOU SHOULD EXPECT: Some feelings of bloating in the abdomen. Passage of more gas than usual.  Walking can help get rid of the air that was put into your GI tract during the procedure and reduce the bloating. If you had a lower endoscopy (such as a colonoscopy or flexible sigmoidoscopy) you may notice spotting of blood in your stool or on the toilet paper. If you underwent a bowel prep for your procedure, you may not have a normal bowel movement for a few days.  Please Note:  You might notice some irritation and congestion in your nose or some drainage.  This is from the oxygen used during your procedure.  There is no need for concern and it should clear up in a day or so.  SYMPTOMS TO REPORT IMMEDIATELY:  Following lower endoscopy (colonoscopy or flexible sigmoidoscopy):  Excessive amounts of blood in the stool  Significant tenderness or worsening of abdominal pains  Swelling of the abdomen that is new, acute  Fever of 100F or higher  For urgent or emergent issues, a gastroenterologist can be reached at any hour by calling [phone]. Do not use MyChart messaging for urgent concerns.    DIET:  We do recommend a small meal at first, but then you may proceed to your regular diet.  Drink  plenty of fluids but you should avoid alcoholic beverages for 24 hours.  ACTIVITY:  You should plan to take it easy for the rest of today and you should NOT DRIVE or use heavy machinery until tomorrow (because of the sedation medicines used during the test).    FOLLOW UP: Our staff will call the number listed on your records the next business day following your procedure.  We will call around 7:15- 8:00 am to check on you and address any questions or concerns that you may have regarding the information given to you following your procedure. If we do not reach you, we will leave a message.     If any biopsies were taken you will be contacted by phone or by letter within the next 1-3 weeks.  Please call us  at [phone] if you have not heard about the biopsies in 3 weeks.    SIGNATURES/CONFIDENTIALITY: You and/or your care partner have signed paperwork which will be entered into your electronic medical record.  These signatures attest to the fact that that the information above on your After Visit Summary has been reviewed and is understood.  Full responsibility of the confidentiality of this discharge information lies with you and/or your care-partner.

## 2024-02-07 ENCOUNTER — Telehealth: Payer: Self-pay

## 2024-02-07 NOTE — Telephone Encounter (Signed)
  Follow up Call-     02/06/2024    7:30 AM  Call back number  Post procedure Call Back phone  # [phone]  Permission to leave phone message Yes     Patient questions:  Do you have a fever, pain , or abdominal swelling? No. Pain Score  0 *  Have you tolerated food without any problems? Yes.    Have you been able to return to your normal activities? Yes.    Do you have any questions about your discharge instructions: Diet   No. Medications  No. Follow up visit  No.  Do you have questions or concerns about your Care? No.  Actions: * If pain score is 4 or above: No action needed, pain <4.

## 2024-02-11 LAB — SURGICAL PATHOLOGY

## 2024-02-12 ENCOUNTER — Ambulatory Visit: Payer: Self-pay | Admitting: Gastroenterology
